# Patient Record
Sex: Female | Born: 1980 | Race: Asian | Hispanic: No | Marital: Single | Smoking: Never smoker
Health system: Southern US, Community
[De-identification: ages and names within clinical notes are randomized; demographics above are authoritative.]

## PROBLEM LIST (undated history)

## (undated) DIAGNOSIS — B029 Zoster without complications: Secondary | ICD-10-CM

---

## 1999-04-02 ENCOUNTER — Ambulatory Visit (HOSPITAL_COMMUNITY): Admission: RE | Admit: 1999-04-02 | Discharge: 1999-04-02 | Payer: Self-pay | Admitting: *Deleted

## 1999-05-09 ENCOUNTER — Inpatient Hospital Stay (HOSPITAL_COMMUNITY): Admission: AD | Admit: 1999-05-09 | Discharge: 1999-05-12 | Payer: Self-pay | Admitting: *Deleted

## 1999-05-09 ENCOUNTER — Encounter (INDEPENDENT_AMBULATORY_CARE_PROVIDER_SITE_OTHER): Payer: Self-pay

## 1999-05-14 ENCOUNTER — Inpatient Hospital Stay (HOSPITAL_COMMUNITY): Admission: AD | Admit: 1999-05-14 | Discharge: 1999-05-14 | Payer: Self-pay | Admitting: Obstetrics & Gynecology

## 1999-05-21 ENCOUNTER — Inpatient Hospital Stay (HOSPITAL_COMMUNITY): Admission: AD | Admit: 1999-05-21 | Discharge: 1999-05-21 | Payer: Self-pay | Admitting: Obstetrics & Gynecology

## 2001-10-04 ENCOUNTER — Inpatient Hospital Stay (HOSPITAL_COMMUNITY): Admission: EM | Admit: 2001-10-04 | Discharge: 2001-10-05 | Payer: Self-pay | Admitting: *Deleted

## 2001-10-04 ENCOUNTER — Encounter: Payer: Self-pay | Admitting: *Deleted

## 2007-06-09 ENCOUNTER — Inpatient Hospital Stay (HOSPITAL_COMMUNITY): Admission: AD | Admit: 2007-06-09 | Discharge: 2007-06-13 | Payer: Self-pay | Admitting: Gynecology

## 2007-06-10 ENCOUNTER — Encounter (INDEPENDENT_AMBULATORY_CARE_PROVIDER_SITE_OTHER): Payer: Self-pay | Admitting: Obstetrics

## 2008-06-24 ENCOUNTER — Ambulatory Visit: Payer: Self-pay | Admitting: Family Medicine

## 2008-06-24 ENCOUNTER — Inpatient Hospital Stay (HOSPITAL_COMMUNITY): Admission: AD | Admit: 2008-06-24 | Discharge: 2008-06-24 | Payer: Self-pay | Admitting: Obstetrics & Gynecology

## 2008-09-28 ENCOUNTER — Inpatient Hospital Stay (HOSPITAL_COMMUNITY): Admission: AD | Admit: 2008-09-28 | Discharge: 2008-09-28 | Payer: Self-pay | Admitting: Obstetrics

## 2008-09-30 ENCOUNTER — Inpatient Hospital Stay (HOSPITAL_COMMUNITY): Admission: AD | Admit: 2008-09-30 | Discharge: 2008-10-03 | Payer: Self-pay | Admitting: Obstetrics

## 2008-12-09 IMAGING — US US OB TRANSVAGINAL MODIFY
1 series · 14 of 25 positions shown · non-contrast
Comparison: none

OBSTETRICAL ULTRASOUND:

 This ultrasound exam was performed in the [HOSPITAL] Ultrasound Department.  The OB US report was generated in the AS system, and faxed to the ordering physician.  This report is also available in [REDACTED] PACS.

[Series 1: us ob transvaginal modify · 25 acquisitions, 14 frames shown]
[im 1/25]
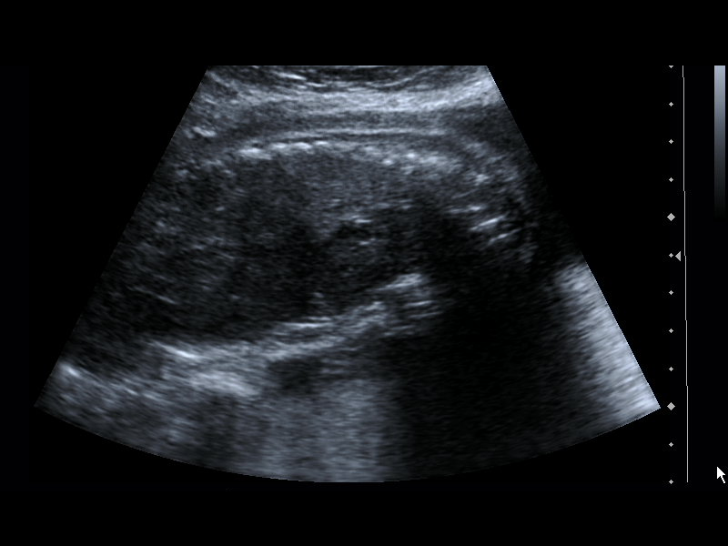
[im 3/25]
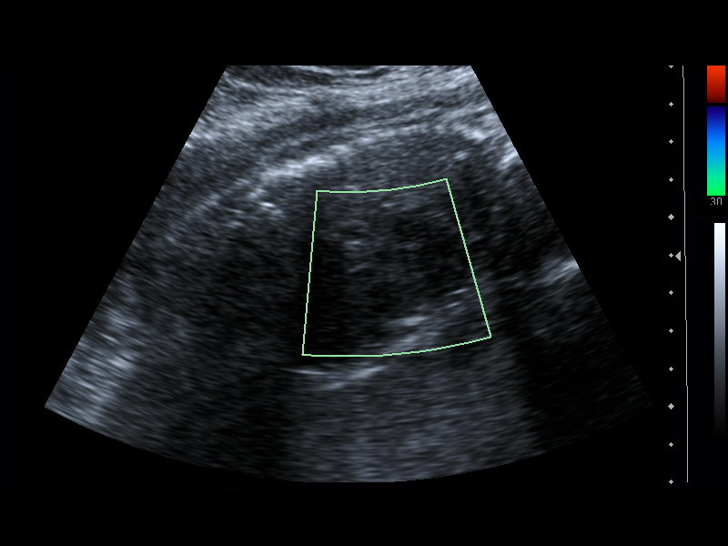
[im 5/25]
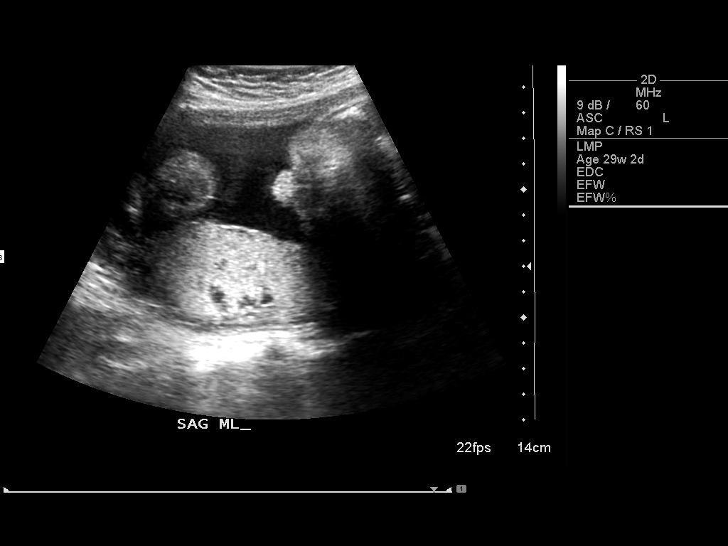
[im 7/25]
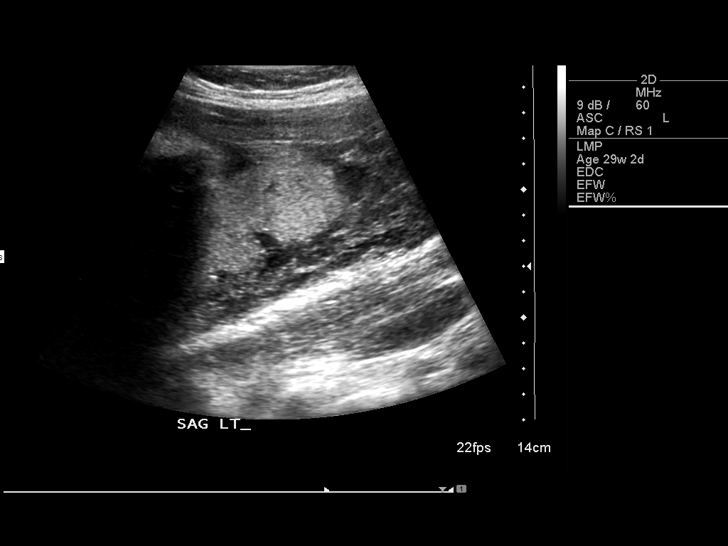
[im 9/25]
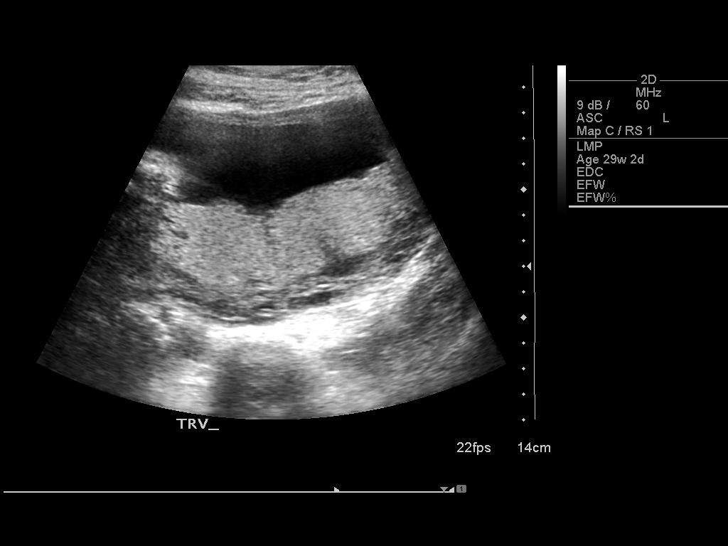
[im 10/25]
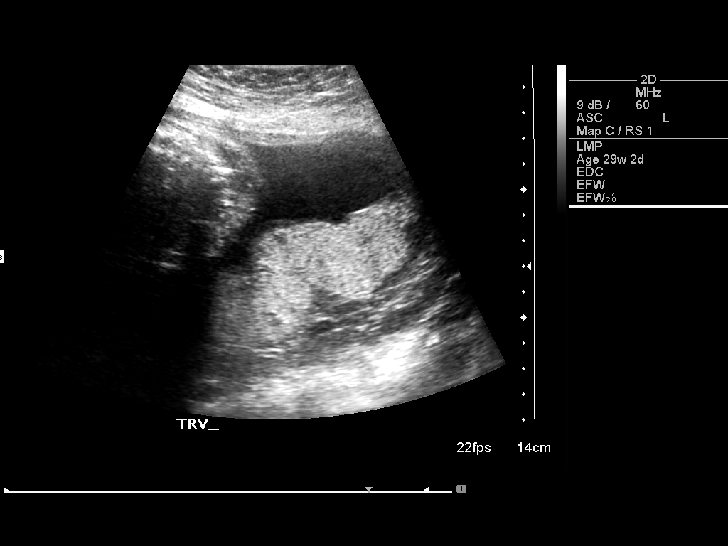
[im 12/25]
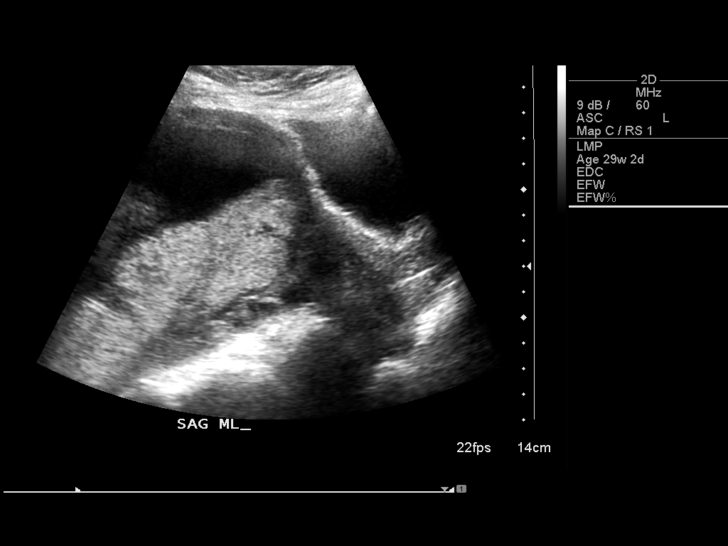
[im 14/25]
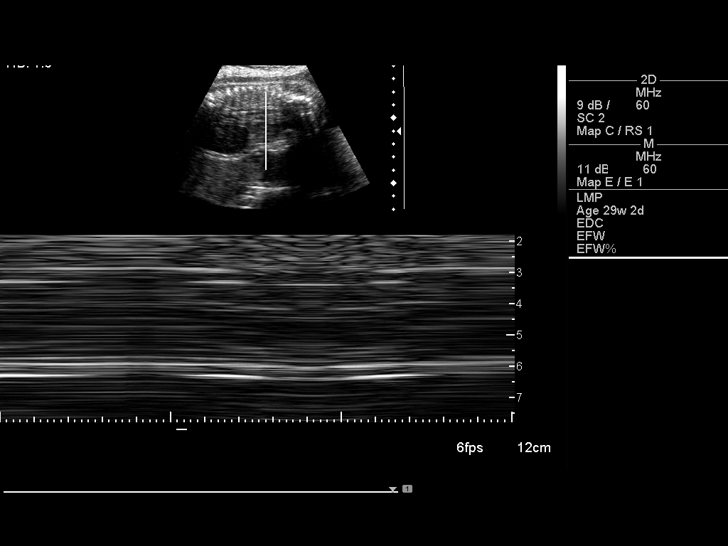
[im 16/25]
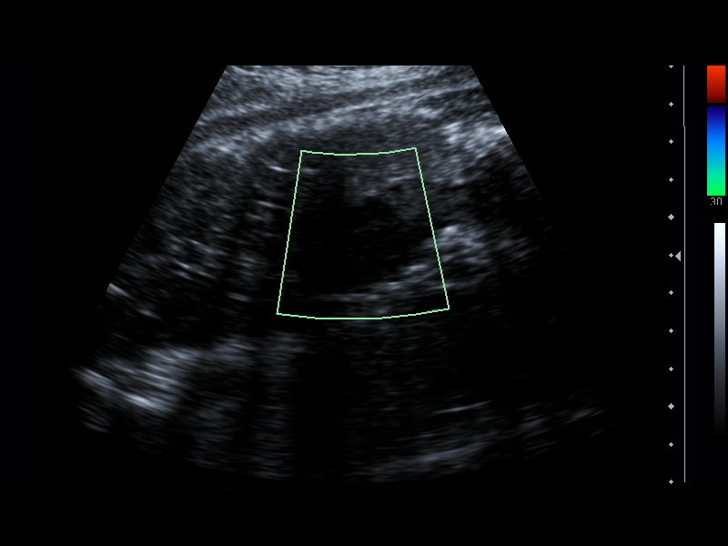
[im 17/25]
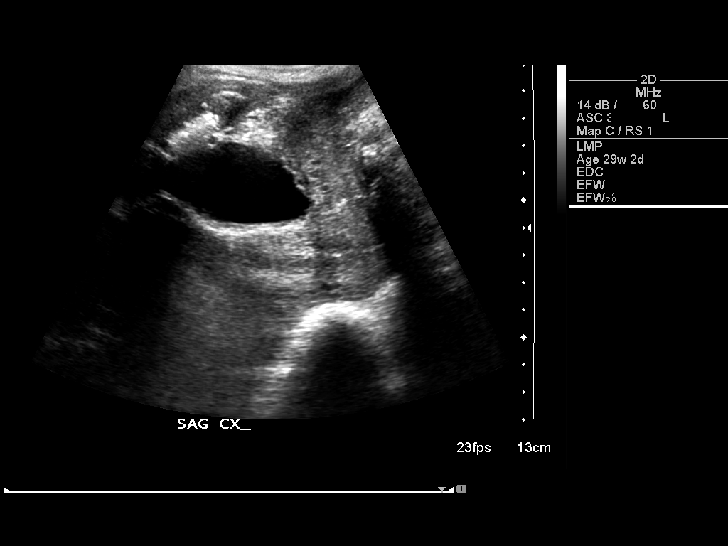
[im 19/25]
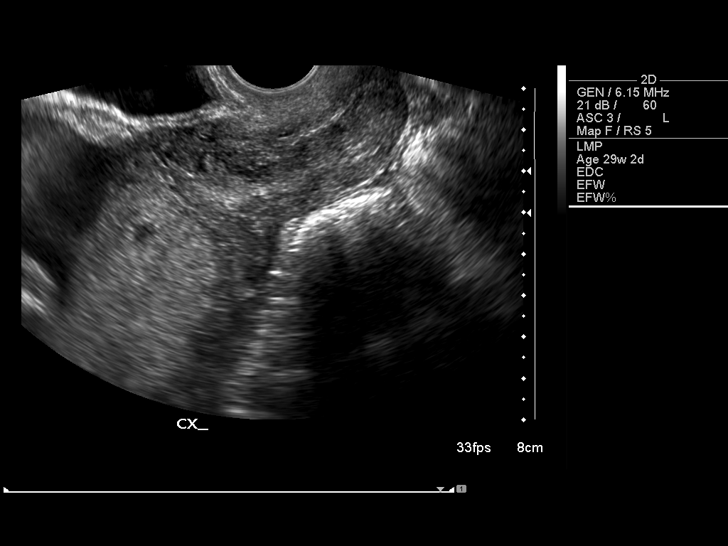
[im 21/25]
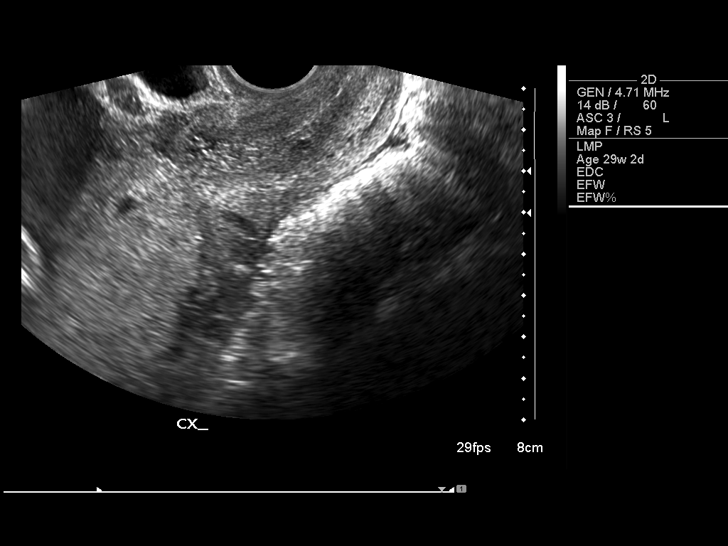
[im 23/25]
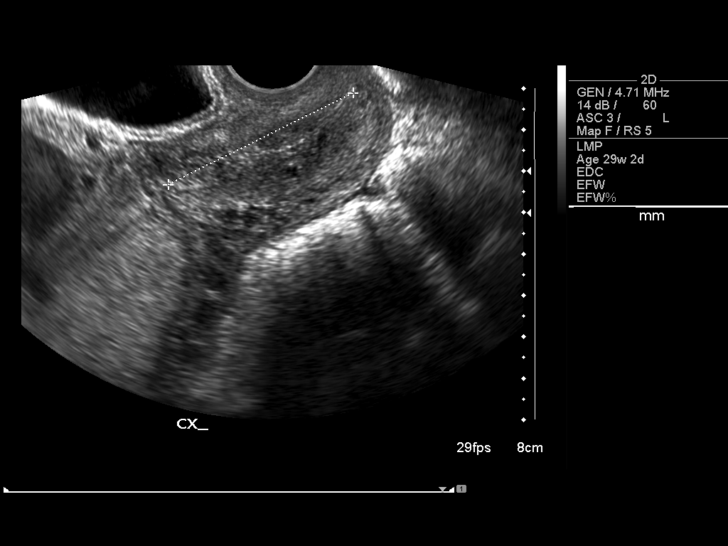
[im 25/25]
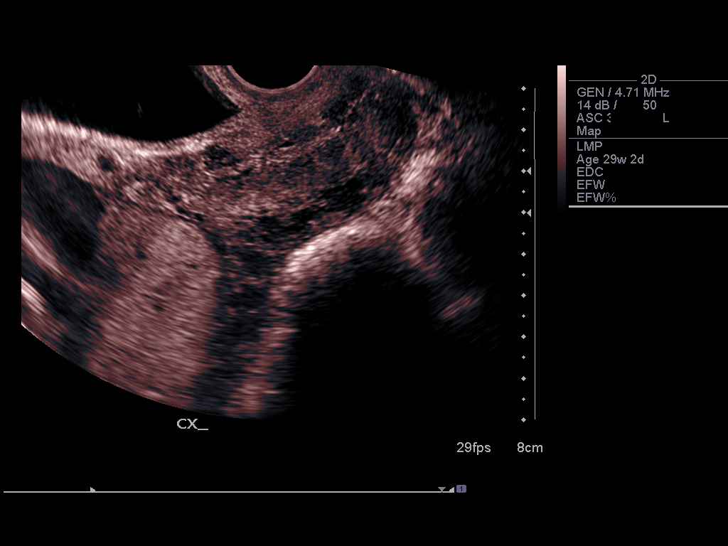

[14 of 25 positions shown; findings below may reference images not displayed]

IMPRESSION: See AS Obstetric US report.

## 2011-03-12 NOTE — Op Note (Signed)
[name], Melinda Alvarez                   ACCOUNT NO.:  [id number]   MEDICAL RECORD NO.:  [id number]          PATIENT TYPE:  INP   LOCATION:  9109                          FACILITY:  [facility]   PHYSICIAN:  Kathreen Cosier, M.D.DATE OF BIRTH:  05-14-1981   DATE OF PROCEDURE:  09/30/2008  DATE OF DISCHARGE:  10/03/2008                               OPERATIVE REPORT   PREOPERATIVE DIAGNOSES:  Previous cesarean section at term with ruptured  membranes.   POSTOPERATIVE DIAGNOSES:  Previous cesarean section at term with  ruptured membranes.   SURGEON:  Kathreen Cosier, MD   FIRST ASSISTANT:  Charles A. Clearance Coots, M.D.   ANESTHESIA:  Spinal.   PROCEDURE:  The patient was placed on the operating table in supine  position.  After the spinal administered,  abdomen prepped and draped.  Bladder emptied with a Foley catheter.  Transverse suprapubic incision  made through the old scar and carried down to the direct fascia.  Fascia  cleaned and incised to the length of the incision.  Recti muscles  retracted laterally.  Peritoneum incised  longitudinally.  Transverse  incision made in the visceral peritoneum above the bladder,  mobilized  inferiorly.  Transverse lower uterine incision made and the fluid was  clear.  She had a female, Apgar 9 and 9, weighing 6 pounds 7 ounces from  the LOA position.  The placenta was fundal removed manually and sent to  Labor and Delivery. Uterine cavity cleaned with dry laps.  The uterine  incision closed in 1 layer with continuous suture of #1 chromic.  Hemostasis was satisfactory. Bladder flap reattached with 2-0 chromic.  Uterus well contracted.  Tubes and ovaries normal.  Abdomen closed in  layers, peritoneum continuous suture of 0-chromic, fascia continuous  suture of 0-Dexon and skin closed with subcuticular stitch of 4-0  Monocryl.  Blood loss 800 mL.           ______________________________  Kathreen Cosier, M.D.     BAM/MEDQ  D:  11/09/2008  T:   11/09/2008  Job:  [id number]

## 2011-03-12 NOTE — Op Note (Signed)
NAMERANDAL, YEPIZ                   ACCOUNT NO.:  [id number]   MEDICAL RECORD NO.:  [id number]          PATIENT TYPE:  INP   LOCATION:  9303                          FACILITY:  [facility]   PHYSICIAN:  Kathreen Cosier, M.D.DATE OF BIRTH:  09-06-1981   DATE OF PROCEDURE:  06/10/2007  DATE OF DISCHARGE:                               OPERATIVE REPORT   PREOPERATIVE DIAGNOSIS:  Intrauterine fetal demise at 30 weeks and  posterior placenta previa.   SURGEON:  Dr. Gaynell Face   FIRST ASSISTANT:  Dr. Clearance Coots.   PROCEDURE:  Repeat low transverse cesarean section.   DESCRIPTION OF PROCEDURE:  Using spinal.  The patient in the supine  position.  Abdomen prepped and draped. Bladder emptied with Foley  catheter.  Transverse suprapubic incision made through old scar, carried  down to rectus fascia. Fascia cleaned and incised length of incision.  Recti muscles retracted laterally.  Peritoneum incised longitudinally.  Transverse lower uterine incision made.  Fluid clear.  The patient  delivered a stillborn female weighing 2 pounds 6 ounces from the  transverse position. The placenta was posterior previa removed manually  and sent to pathology.  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  The  umbilicus and the cord was also hyper twisted.  Uterine cavity cleaned  with dry laps.  Uterine incision closed in one layer with continuous  suture of #1 chromic.  Bladder flap reattached 2-0 chromic.  Tubes and  ovaries normal.  Abdomen closed in layers, peritoneum continuous suture  of 0 chromic, fascia continuous suture of 0 Dexon, skin closed with  subcuticular stitch of 4-0 Monocryl.  Blood loss 600 mL.           ______________________________  Kathreen Cosier, M.D.     BAM/MEDQ  D:  06/10/2007  T:  06/10/2007  Job:  [id number]

## 2011-03-15 NOTE — Discharge Summary (Signed)
[name], Melinda Alvarez                   ACCOUNT NO.:  [id number]   MEDICAL RECORD NO.:  [id number]          PATIENT TYPE:  INP   LOCATION:  9303                          [facility]ILITY:  [facility]   PHYSICIAN:  Kathreen Cosier, M.D.DATE OF BIRTH:  [birth year]   DATE OF ADMISSION:  06/09/2007  DATE OF DISCHARGE:  06/13/2007                               DISCHARGE SUMMARY   HISTORY:  The patient is a 30 year old gravida 3, para 2, 0,  0, 2, who  had two previous C-sections.  Her EDC is August 23, 2007.  The patient  came in saying that she has no movement of the baby since Monday, prior  to admission.  An ultrasound confirmed fetal demise at 30 weeks.  She  also had a posterior placenta previa and two previous C-sections.   HOSPITAL COURSE:  She underwent a repeat low transverse cesarean section  because of fetal demise and placenta previa.  The baby was in the  transverse lie, and weighed 2 pounds 6 ounces.  The cord was infarcted  near the insertion into the umbilicus, and the cord was twisted multiple  times.  The blood loss was approximately 600 mL.   On admission her hemoglobin was 12.6.  Postoperatively the hemoglobin  was 11.8.  Platelets were 203 and 211.  White count was 15 and 20.  PT  and PTT normal.  Urine negative.  RPR negative.  HIV negative.   DISPOSITION:  She did well postoperatively and was discharged on the  third postoperative day on Tylox one p.o. q.3-4h. p.r.n.   FOLLOWUP:  She is to see me in six weeks.   DISCHARGE DIAGNOSES:  1. Repeat low transverse cesarean section.  2. Fetal demise at 30 weeks with a placenta previa.           ______________________________  Kathreen Cosier, M.D.     BAM/MEDQ  D:  07/08/2007  T:  07/08/2007  Job:  [id number]

## 2011-03-15 NOTE — Discharge Summary (Signed)
Melinda Alvarez, MADRUGA                   ACCOUNT NO.:  [id number]   MEDICAL RECORD NO.:  [id number]          PATIENT TYPE:  INP   LOCATION:  9109                          [facility]ILITY[facility]  [facility]   PHYSICIAN:  Kathreen Cosier, M.D.DATE OF BIRTH:  10/05/1981   DATE OF ADMISSION:  09/30/2008  DATE OF DISCHARGE:  10/03/2008                               DISCHARGE SUMMARY   The patient is a 30 year old, gravida 4, para 1-2-0-2, [facility] October 09, 2008, three C-sections.  She was admitted with ruptured membranes, fluid  clear, and contracting.  She underwent a repeat low-transverse cesarean  section and a female Apgar 9 and 9, weighing 6 pounds 7 ounces.  Blood  loss 800 mL.  Postop, she did well.  On the third postoperative day, her  hemoglobin was 10.1.  She was discharged home on Tylox and ferrous  sulfate to see me in 6 weeks.   DISCHARGE DIAGNOSES:  Status post repeat low transverse cesarean section  at term in labor, ruptured membranes.           ______________________________  Kathreen Cosier, M.D.     BAM/MEDQ  D:  11/02/2008  T:  11/02/2008  Job:  [id number]

## 2011-08-02 LAB — CBC
HCT: 40.7 % (ref 36.0–46.0)
Hemoglobin: 10.1 g/dL — ABNORMAL LOW (ref 12.0–15.0)
Hemoglobin: 13 g/dL (ref 12.0–15.0)
MCHC: 31.9 g/dL (ref 30.0–36.0)
MCHC: 32.9 g/dL (ref 30.0–36.0)
MCV: 79.2 fL (ref 78.0–100.0)
MCV: 79.7 fL (ref 78.0–100.0)
Platelets: 169 10*3/uL (ref 150–400)
RBC: 3.89 MIL/uL (ref 3.87–5.11)
RBC: 5.1 MIL/uL (ref 3.87–5.11)
RDW: 14.5 % (ref 11.5–15.5)
WBC: 14.8 10*3/uL — ABNORMAL HIGH (ref 4.0–10.5)
WBC: 18.6 10*3/uL — ABNORMAL HIGH (ref 4.0–10.5)

## 2011-08-12 LAB — CBC
HCT: 35.8 — ABNORMAL LOW
Hemoglobin: 12.6
MCHC: 32.5
MCV: 83.4
Platelets: 211
RBC: 4.65
RDW: 13.7
WBC: 15 — ABNORMAL HIGH
WBC: 20.9 — ABNORMAL HIGH

## 2011-08-12 LAB — URINALYSIS, ROUTINE W REFLEX MICROSCOPIC
Glucose, UA: NEGATIVE
Hgb urine dipstick: NEGATIVE
Protein, ur: NEGATIVE
Specific Gravity, Urine: 1.015
Urobilinogen, UA: 0.2

## 2011-08-12 LAB — ABO/RH: ABO/RH(D): B POS

## 2011-08-12 LAB — PROTIME-INR: INR: 0.9

## 2014-06-03 ENCOUNTER — Ambulatory Visit (INDEPENDENT_AMBULATORY_CARE_PROVIDER_SITE_OTHER): Payer: 59 | Admitting: Family Medicine

## 2014-06-03 VITALS — BP 110/62 | HR 62 | Temp 98.5°F | Resp 18 | Ht 60.5 in | Wt 138.0 lb

## 2014-06-03 DIAGNOSIS — R51 Headache: Secondary | ICD-10-CM

## 2014-06-03 DIAGNOSIS — R110 Nausea: Secondary | ICD-10-CM

## 2014-06-03 NOTE — Progress Notes (Signed)
Chief Complaint:  Chief Complaint  Patient presents with  . Headache    since yesterday   . Nausea    HPI: Melinda Alvarez is a 33 y.o. female who is here for intermittent HA for the last 1  Year, during these time she feels dizzy, she feels sick to her stomach the first thing she has heaaches,  Light and noise bothers her. Mom has headache issues. She has tried advil, took it 2x at 7:30 pm last night  and this morninng at 7 am. She took the advil 200 mg 2 pills. She has no problems Eating and drinking. Does not feel numbness and tingling.  No cough. NO URI sxs, no known triggers but  Sometimes certain foods trigger her sxs, yesterday she ate 1x and then felt sick. She feels actually better today. She is a Advertising account plannernail technician and will often go without food or drink until there is a down time and she has never known to have any ypoglycemia.  She deneie sany confusion or eye pain. LMP was 1 week ago. She is positive she is not pregnant.   History reviewed. No pertinent past medical history. History reviewed. No pertinent past surgical history. History   Social History  . Marital Status: Single    Spouse Name: N/A    Number of Children: N/A  . Years of Education: N/A   Social History Main Topics  . Smoking status: Never Smoker   . Smokeless tobacco: None  . Alcohol Use: No  . Drug Use: No  . Sexual Activity: None   Other Topics Concern  . None   Social History Narrative  . None   History reviewed. No pertinent family history. No Known Allergies Prior to Admission medications   Medication Sig Start Date End Date Taking? Authorizing Provider  ibuprofen (ADVIL,MOTRIN) 200 MG tablet Take 200 mg by mouth every 6 (six) hours as needed.   Yes Historical Provider, MD     ROS: The patient denies fevers, chills, night sweats, unintentional weight loss, chest pain, palpitations, wheezing, dyspnea on exertion, nausea, vomiting, abdominal pain, dysuria, hematuria, melena, numbness,  weakness, or tingling.   All other systems have been reviewed and were otherwise negative with the exception of those mentioned in the HPI and as above.    PHYSICAL EXAM: Filed Vitals:   06/03/14 1421  BP: 110/62  Pulse: 62  Temp: 98.5 F (36.9 C)  Resp: 18   Filed Vitals:   06/03/14 1421  Height: 5' 0.5" (1.537 m)  Weight: 138 lb (62.596 kg)   Body mass index is 26.5 kg/(m^2).  General: Alert, no acute distress HEENT:  Normocephalic, atraumatic, oropharynx patent. EOMI, PERRLA, fundo exam normal Cardiovascular:  Regular rate and rhythm, no rubs murmurs or gallops.  No Carotid bruits, radial pulse intact. No pedal edema.  Respiratory: Clear to auscultation bilaterally.  No wheezes, rales, or rhonchi.  No cyanosis, no use of accessory musculature GI: No organomegaly, abdomen is soft and non-tender, positive bowel sounds.  No masses. Skin: No rashes. Neurologic: Facial musculature symmetric. CN 2-12 grossly normal Psychiatric: Patient is appropriate throughout our interaction. Lymphatic: No cervical lymphadenopathy Musculoskeletal: Gait intact.   LABS:    EKG/XRAY:   Primary read [facility]   ASSESSMENT/PLAN: Encounter Diagnoses  Name Primary?  . Headache(784.0) Yes  . Nausea alone    Decline meds Try otc medicine since working Wanted labs so will return for annual visit to get fastign  labs on her day off F/u prn  Gross sideeffects, risk and benefits, and alternatives of medications d/w patient. Patient is aware that all medications have potential sideeffects and we are unable to predict every sideeffect or drug-drug interaction that may occur.  Melinda Steenbergen PHUONG, DO 06/03/2014 5:22 PM

## 2014-06-03 NOTE — Patient Instructions (Signed)
?au n?a ??u. (Migraine Headache) ?au n?a ??u l m?t c?n ?au nhi v nhi?u ? m?t bn ??u c?a qu v?. M?t c?n ?au n?a ??u c th? ko di t? 30 pht ??n vi ti?ng. NGUYN NHN.  Nguyn nhn chnh xc c?a ?au n?a ??u khng ph?i lc no c?ng xc ??nh ???c. Tuy nhin, ?au n?a ??u c th? pht sinh khi cc dy th?n kinh trong no b? kch thch v gi?i phng ra cc ha ch?t gy vim. Hi?n t??ng ny gy ra ?au. M?t s? v?n ?? khc c?ng c th? gy ra ?au n?a ??u, ch?ng h?n:  R??u.  Ht thu?c l.  C?ng th?ng.  Kinh nguy?t.  Pho mt ?? lu.  Th?c ?n ho?c ?? u?ng c ch?a nitrat, glutamate, aspartame, ho?c tyramine.  Thi?u ng?.  S c la.  Caffeine.  ?i.  G?ng s?c.  M?t m?i.  Thu?c dng ?? ?i?u tr? ?au ng?c (nitroglycerine), vin thu?c trnh [place], estrogen, v m?t s? thu?c ?i?u tr? huy?t p. D?U HI?U V TRI?U CH?NG  ?au ? m?t bn ho?c c? hai bn ??u.  ?au t?ng c?n ho?c ?au nhi.  ?au nhi?u lm c?n tr? cc ho?t ??ng hng ngy.  C?n ?au k?ch pht khi c b?t k? ho?t ??ng th? ch?t no.  Bu?n nn, nn m?a, ho?c c? hai.  Chng m?t.  ?au khi ti?p xc v?i nh sng chi, ti?ng ?n l?n, ho?c ho?t ??ng.  Nh?y c?m ton thn v?i nh sng chi, ti?ng ?n l?n, ho?c mi. Tr??c khi qu v? b? ?au n?a ??u, qu v? c th? c nh?ng d?u hi?u c?nh bo s?p c c?n ?au n?a ??u (ti?n tri?u). M?t ti?n tri?u c th? bao g?m:  Nhn th?y nh sng lo ln.  Nhn th?y nh?ng ?i?m sng, qu?ng sng, ho?c cc ???ng ngo?n ngoo.  C th? tr??ng hnh ?ng ho?c nhn m?.  C c?m gic t b ho?c ?au bu?t.  Ni kh.  B? y?u c?. CH?N ?ON  ?au n?a ??u th??ng ???c ch?n ?on d?a vo:  Cc tri?u ch?ng.  Khm th?c th?.  Ch?p CT ho?c MRI ??u qu v?. Cc ki?m tra b?ng hnh ?nh ny khng th? ch?n ?on ???c ?au n?a ??u, nh?ng chng c th? gip lo?i tr? nh?ng nguyn nhn gy ?au ??u khc. ?I?U TR? C th? cho dng thu?c gi?m ?au v ch?ng bu?n nn. C?ng c th? cho dng thu?c ?? ng?n ng?a ti di?n ?au n?a ??u.  H??NG D?N  CH?M [place] T?I NH  Ch? s? d?ng thu?c khng c?n k ??n ho?c thu?c c?n k ??n ?? gi?m ?au ho?c gi?m c?m gic kh ch?u theo ch? d?n c?a chuyn gia ch?m [place] s?c kh?e c?a qu v?. Khng nn dng thu?c m gy nghi?n ko di.  N?m trong m?t phng t?i, yn t?nh khi qu v? b? ?au n?a ??u.  Ghi nh?t k hng ngy ?? tm ra ?i?u g c th? gy cc c?n ?au n?a ??u. Ch?ng h?n, hy ghi ra:  Qu v? ?n v u?ng g.  Qu v? ? ng? bao lu.  B?t k? thay ??i no trong ch? ?? ?n ho?c thu?c men.  H?n ch? s? d?ng r??u.  B? thu?c l, n?u qu v? ht thu?c.  Ng? 7 - 9 ti?ng, ho?c theo khuy?n ngh? c?a chuyn gia ch?m [place] s?c kh?e.  H?n ch? c?ng th?ng.  Gi? cho nh sng d?u nh? n?u nh sng m?nh lm qu v? kh ch?u v lm ch?ng ?au n?a ??  u t?i t? h?n. NGAY L?P T?C ?I KHM N?U:   C?n ?au n?a ??u c?a qu v? n?ng h?n.  Qu v? b? s?t.  Qu v? b? c?ng c?.  Qu v? b? m?t th? l?c.  Qu v? b? y?u c? ho?c m?t ki?m sot c?.  Qu v? b?t ??u m?t th?ng b?ng ho?c ?i l?i kh kh?n.  Qu v? c?m th?y mu?n ng?t ho?c ng?t.  Qu v? c nh?ng tri?u ch?ng n?ng khc v?i nh?ng tri?u ch?ng ban ??u. ??M B?O QU V?:   Hi?u r cc h??ng d?n ny.  S? theo di tnh tr?ng c?a mnh.  S? yu c?u tr? gip ngay l?p t?c n?u qu v? c?m th?y khng kh?e ho?c th?y tr?m tr?ng h?n. Document Released: 10/14/2005 Document Revised: 08/04/2013 [facility] Patient Information 2015 [place], [place]. This information is not intended to replace advice given to you by your health care provider. Make sure you discuss any questions you have with your health care provider. Migraine Headache A migraine headache is an intense, throbbing pain on one or both sides of your head. A migraine can last for 30 minutes to several hours. CAUSES  The exact cause of a migraine headache is not always known. However, a migraine may be caused when nerves in the brain become irritated and release chemicals that cause inflammation. This causes pain. Certain things may also  trigger migraines, such as:  Alcohol.  Smoking.  Stress.  Menstruation.  Aged cheeses.  Foods or drinks that contain nitrates, glutamate, aspartame, or tyramine.  Lack of sleep.  Chocolate.  Caffeine.  Hunger.  Physical exertion.  Fatigue.  Medicines used to treat chest pain (nitroglycerine), birth control pills, estrogen, and some blood pressure medicines. SIGNS AND SYMPTOMS  Pain on one or both sides of your head.  Pulsating or throbbing pain.  Severe pain that prevents daily activities.  Pain that is aggravated by any physical activity.  Nausea, vomiting, or both.  Dizziness.  Pain with exposure to bright lights, loud noises, or activity.  General sensitivity to bright lights, loud noises, or smells. Before you get a migraine, you may get warning signs that a migraine is coming (aura). An aura may include:  Seeing flashing lights.  Seeing bright spots, halos, or zigzag lines.  Having tunnel vision or blurred vision.  Having feelings of numbness or tingling.  Having trouble talking.  Having muscle weakness. DIAGNOSIS  A migraine headache is often diagnosed based on:  Symptoms.  Physical exam.  A CT scan or MRI of your head. These imaging tests cannot diagnose migraines, but they can help rule out other causes of headaches. TREATMENT Medicines may be given for pain and nausea. Medicines can also be given to help prevent recurrent migraines.  HOME CARE INSTRUCTIONS  Only take over-the-counter or prescription medicines for pain or discomfort as directed by your health care provider. The use of long-term narcotics is not recommended.  Lie down in a dark, quiet room when you have a migraine.  Keep a journal to find out what may trigger your migraine headaches. For example, write down:  What you eat and drink.  How much sleep you get.  Any change to your diet or medicines.  Limit alcohol consumption.  Quit smoking if you smoke.  Get 7-9  hours of sleep, or as recommended by your health care provider.  Limit stress.  Keep lights dim if bright lights bother you and make your migraines worse. SEEK IMMEDIATE MEDICAL CARE IF:   Your  migraine becomes severe.  You have a fever.  You have a stiff neck.  You have vision loss.  You have muscular weakness or loss of muscle control.  You start losing your balance or have trouble walking.  You feel faint or pass out.  You have severe symptoms that are different from your first symptoms. MAKE SURE YOU:   Understand these instructions.  Will watch your condition.  Will get help right away if you are not doing well or get worse. Document Released: 10/14/2005 Document Revised: 02/28/2014 Document Reviewed: 06/21/2013 [facility] Patient Information 2015 [place], [place]. This information is not intended to replace advice given to you by your health care provider. Make sure you discuss any questions you have with your health care provider.

## 2017-12-30 ENCOUNTER — Other Ambulatory Visit: Payer: Self-pay

## 2017-12-30 ENCOUNTER — Ambulatory Visit (INDEPENDENT_AMBULATORY_CARE_PROVIDER_SITE_OTHER): Payer: BLUE CROSS/BLUE SHIELD | Admitting: Family Medicine

## 2017-12-30 ENCOUNTER — Encounter: Payer: Self-pay | Admitting: Family Medicine

## 2017-12-30 VITALS — BP 110/64 | HR 71 | Temp 98.0°F | Ht 60.0 in | Wt 137.0 lb

## 2017-12-30 DIAGNOSIS — R197 Diarrhea, unspecified: Secondary | ICD-10-CM | POA: Diagnosis not present

## 2017-12-30 DIAGNOSIS — Z13220 Encounter for screening for lipoid disorders: Secondary | ICD-10-CM | POA: Diagnosis not present

## 2017-12-30 DIAGNOSIS — E049 Nontoxic goiter, unspecified: Secondary | ICD-10-CM | POA: Diagnosis not present

## 2017-12-30 DIAGNOSIS — R1084 Generalized abdominal pain: Secondary | ICD-10-CM | POA: Insufficient documentation

## 2017-12-30 DIAGNOSIS — N938 Other specified abnormal uterine and vaginal bleeding: Secondary | ICD-10-CM | POA: Diagnosis not present

## 2017-12-30 DIAGNOSIS — Z0000 Encounter for general adult medical examination without abnormal findings: Secondary | ICD-10-CM | POA: Diagnosis not present

## 2017-12-30 DIAGNOSIS — K30 Functional dyspepsia: Secondary | ICD-10-CM

## 2017-12-30 LAB — POCT URINALYSIS DIP (MANUAL ENTRY)
Bilirubin, UA: NEGATIVE
Glucose, UA: NEGATIVE mg/dL
Ketones, POC UA: NEGATIVE mg/dL
Leukocytes, UA: NEGATIVE
Nitrite, UA: NEGATIVE
Protein Ur, POC: NEGATIVE mg/dL
Spec Grav, UA: 1.025 (ref 1.010–1.025)
Urobilinogen, UA: 0.2 E.U./dL
pH, UA: 6 (ref 5.0–8.0)

## 2017-12-30 NOTE — Patient Instructions (Signed)
     IF you received an x-ray today, you will receive an invoice from [facility]. Please contact [facility] at [phone] with questions or concerns regarding your invoice.   IF you received labwork today, you will receive an invoice from [facility]. Please contact [facility] at [phone] with questions or concerns regarding your invoice.   Our billing staff will not be able to assist you with questions regarding bills from these companies.  You will be contacted with the lab results as soon as they are available. The fastest way to get your results is to activate your My Chart account. Instructions are located on the last page of this paperwork. If you have not heard from us regarding the results in 2 weeks, please contact this office.     

## 2017-12-30 NOTE — Progress Notes (Deleted)
   3/5/20194:49 PM  Melinda Alvarez Apr 16, 1981, [age] y.o. female [id number]  Chief Complaint  Patient presents with  . Pain    Having pain in the stomach when eating spicy or left overs. This is been going on for 2 yrs. Having frequent diarrrhea. Having painful periods for the past 2 yrs. Pt says she does not need pap, that was done 9/18    HPI:   Patient is a 37 y.o. female with past medical history significant for *** who presents today for ***  Depression screen PHQ 2/9 12/30/2017  Decreased Interest 0  Down, Depressed, Hopeless 0  PHQ - 2 Score 0    No Known Allergies  Prior to Admission medications   Medication Sig Start Date End Date Taking? Authorizing Provider  ibuprofen (ADVIL,MOTRIN) 200 MG tablet Take 200 mg by mouth every 6 (six) hours as needed.    [provider]    History reviewed. No pertinent past medical history.  Past Surgical History:  Procedure Laterality Date  . CESAREAN SECTION      Social History   Tobacco Use  . Smoking status: Never Smoker  . Smokeless tobacco: Never Used  Substance Use Topics  . Alcohol use: No    Family History  Problem Relation Age of Onset  . Healthy Mother   . Healthy Father   . Healthy Sister   . Healthy Brother     ROS   OBJECTIVE:  There were no vitals taken for this visit.  Physical Exam  No results found for this or any previous visit (from the past 24 hour(s)).  No results found.   ASSESSMENT and PLAN  ***  No Follow-up on file.    Myles LippsIrma M Santiago, MD [facility] [address] [place], [place] [address] Ph.  [phone] Fax [phone]

## 2017-12-30 NOTE — Progress Notes (Signed)
3/5/20194:53 PM  Melinda Alvarez 1980/12/28, 37 y.o. female [id number]  Chief Complaint  Patient presents with  . Annual Exam    Having pain in the stomach when eating spicy or left overs. This is been going on for 2 yrs. Having frequent diarrrhea. Having painful periods for the past 2 yrs. Pt says she does not need pap, that was done 9/18    HPI:   Patient is a 37 y.o. female  who presents today for CPE  Last CPE was several years ago [facility], started on OCPs, tolerating well, happy with method G4P4 Denies abnormal pap Reports menses light but last about 2 weeks Declines flu vaccine  Main concern is recurring diarrhea, states that for past 2 years, worsening in frequency and intensity, now about once or twice a week with over 10 episodes of watery diarrhea in one day, normally resolve within 24 hours Triggers food, spicy foods but sometimes over things Starting to cause food aversion as concern for severity of symptoms, but appetite itself ok She feels some indigestion but no frank abd pain during these periods She denies any nausea or vomiting, weight loss.  Denies fever, chills, night sweats, swollen nodes She denies any fhx IBD, GI cancers   [facility] 12/30/2017  Decreased Interest 0  Down, Depressed, Hopeless 0  PHQ - 2 Score 0    No Known Allergies  Prior to Admission medications   Medication Sig Start Date End Date Taking? Authorizing Provider  ibuprofen (ADVIL,MOTRIN) 200 MG tablet Take 200 mg by mouth every 6 (six) hours as needed.    [provider]  Ortho tri cyclen lo 1  Tab daily  History reviewed. No pertinent past medical history.  Past Surgical History:  Procedure Laterality Date  . CESAREAN SECTION      Social History   Tobacco Use  . Smoking status: Never Smoker  . Smokeless tobacco: Never Used  Substance Use Topics  . Alcohol use: No    Family History  Problem Relation Age of Onset  . Healthy  Mother   . Healthy Father   . Healthy Sister   . Healthy Brother     Review of Systems  Constitutional: Negative for chills, fever, malaise/fatigue and weight loss.  HENT: Negative for congestion, ear pain, hearing loss and sore throat.   Eyes: Negative for blurred vision and double vision.  Respiratory: Negative for cough and shortness of breath.   Cardiovascular: Negative for chest pain, palpitations and leg swelling.  Gastrointestinal: Positive for diarrhea. Negative for abdominal pain, blood in stool, constipation, melena, nausea and vomiting.  Genitourinary: Negative for dysuria and hematuria.  Musculoskeletal: Negative for myalgias.  Neurological: Negative for dizziness, tingling and headaches.  Psychiatric/Behavioral: Negative for depression. The patient is not nervous/anxious.      OBJECTIVE:  Blood pressure 110/64, pulse 71, temperature 98 F (36.7 C), temperature source Oral, height 5' (1.524 m), weight 137 lb (62.1 kg), SpO2 99 %.  Physical Exam  Constitutional: She is oriented to person, place, and time and well-developed, well-nourished, and in no distress.  HENT:  Head: Normocephalic and atraumatic.  Right Ear: Hearing, tympanic membrane, external ear and ear canal normal.  Left Ear: Hearing, tympanic membrane, external ear and ear canal normal.  Mouth/Throat: Oropharynx is clear and moist.  Eyes: EOM are normal. Pupils are equal, round, and reactive to light.  Neck: Neck supple. Thyromegaly present. No thyroid mass present.  Cardiovascular: Normal rate, regular  rhythm, normal heart sounds and intact distal pulses. Exam reveals no gallop and no friction rub.  No murmur heard. Pulmonary/Chest: Effort normal and breath sounds normal. She has no wheezes. She has no rales.  Abdominal: Soft. Bowel sounds are normal. She exhibits no distension and no mass. There is no tenderness.  Musculoskeletal: Normal range of motion. She exhibits no edema.  Lymphadenopathy:    She  has no cervical adenopathy.  Neurological: She is alert and oriented to person, place, and time. She has normal reflexes. Gait normal.  Skin: Skin is warm and dry.  Psychiatric: Mood and affect normal.  Nursing note and vitals reviewed.   Results for orders placed or performed in visit on 12/30/17 (from the past 24 hour(s))  POCT urinalysis dipstick     Status: Abnormal   Collection Time: 12/30/17  5:05 PM  Result Value Ref Range   Color, UA yellow yellow   Clarity, UA clear clear   Glucose, UA negative negative mg/dL   Bilirubin, UA negative negative   Ketones, POC UA negative negative mg/dL   Spec Grav, UA 1.610 9.604 - 1.025   Blood, UA trace-intact (A) negative   pH, UA 6.0 5.0 - 8.0   Protein Ur, POC negative negative mg/dL   Urobilinogen, UA 0.2 0.2 or 1.0 E.U./dL   Nitrite, UA Negative Negative   Leukocytes, UA Negative Negative    ASSESSMENT and PLAN  1. Annual physical exam Routine HCM labs ordered. HCM reviewed/discussed. Anticipatory guidance regarding healthy weight, lifestyle and choices given.   2. Indigestion - POCT urinalysis dipstick  3. Diarrhea, unspecified type - CBC with Differential - Comprehensive metabolic panel - Ambulatory referral to Gastroenterology  4. Goiter - TSH - US THYROID; Future  5. DUB (dysfunctional uterine bleeding) - TSH  6. Screening for hyperlipidemia - Lipid panel  Return in about 6 weeks (around 02/10/2018).    Myles Lipps, MD Primary Care at [facility] [address] [place], [place] [address] Ph.  [phone] Fax [phone]

## 2017-12-31 LAB — CBC WITH DIFFERENTIAL/PLATELET
Basophils Absolute: 0 10*3/uL (ref 0.0–0.2)
Basos: 0 %
EOS (ABSOLUTE): 0.1 10*3/uL (ref 0.0–0.4)
Eos: 1 %
Hematocrit: 39.3 % (ref 34.0–46.6)
Hemoglobin: 12.7 g/dL (ref 11.1–15.9)
Immature Grans (Abs): 0 10*3/uL (ref 0.0–0.1)
Immature Granulocytes: 0 %
Lymphocytes Absolute: 2.3 10*3/uL (ref 0.7–3.1)
Lymphs: 30 %
MCH: 26.3 pg — ABNORMAL LOW (ref 26.6–33.0)
MCHC: 32.3 g/dL (ref 31.5–35.7)
MCV: 81 fL (ref 79–97)
Monocytes Absolute: 0.5 10*3/uL (ref 0.1–0.9)
Monocytes: 7 %
Neutrophils Absolute: 4.8 10*3/uL (ref 1.4–7.0)
Neutrophils: 62 %
Platelets: 212 10*3/uL (ref 150–379)
RBC: 4.83 x10E6/uL (ref 3.77–5.28)
RDW: 13.1 % (ref 12.3–15.4)
WBC: 7.6 10*3/uL (ref 3.4–10.8)

## 2017-12-31 LAB — COMPREHENSIVE METABOLIC PANEL
ALT: 12 IU/L (ref 0–32)
AST: 13 IU/L (ref 0–40)
Albumin/Globulin Ratio: 1.4 (ref 1.2–2.2)
Albumin: 4.2 g/dL (ref 3.5–5.5)
Alkaline Phosphatase: 67 IU/L (ref 39–117)
BUN/Creatinine Ratio: 17 (ref 9–23)
BUN: 11 mg/dL (ref 6–20)
Bilirubin Total: <0.2 mg/dL (ref 0.0–1.2)
CO2: 21 mmol/L (ref 20–29)
Calcium: 9.1 mg/dL (ref 8.7–10.2)
Chloride: 106 mmol/L (ref 96–106)
Creatinine, Ser: 0.64 mg/dL (ref 0.57–1.00)
GFR calc Af Amer: 131 mL/min/{1.73_m2} (ref >59)
GFR calc non Af Amer: 114 mL/min/{1.73_m2} (ref >59)
Globulin, Total: 3.1 g/dL (ref 1.5–4.5)
Glucose: 99 mg/dL (ref 65–99)
Potassium: 4.4 mmol/L (ref 3.5–5.2)
Sodium: 143 mmol/L (ref 134–144)
Total Protein: 7.3 g/dL (ref 6.0–8.5)

## 2017-12-31 LAB — LIPID PANEL
Chol/HDL Ratio: 3.3 ratio (ref 0.0–4.4)
Cholesterol, Total: 163 mg/dL (ref 100–199)
HDL: 49 mg/dL (ref >39)
LDL Calculated: 88 mg/dL (ref 0–99)
Triglycerides: 129 mg/dL (ref 0–149)
VLDL Cholesterol Cal: 26 mg/dL (ref 5–40)

## 2017-12-31 LAB — TSH: TSH: 0.923 u[IU]/mL (ref 0.450–4.500)

## 2018-01-03 ENCOUNTER — Encounter: Payer: Self-pay | Admitting: Family Medicine

## 2018-02-10 ENCOUNTER — Other Ambulatory Visit: Payer: Self-pay

## 2018-02-10 ENCOUNTER — Ambulatory Visit (INDEPENDENT_AMBULATORY_CARE_PROVIDER_SITE_OTHER): Payer: BLUE CROSS/BLUE SHIELD | Admitting: Family Medicine

## 2018-02-10 ENCOUNTER — Encounter: Payer: Self-pay | Admitting: Family Medicine

## 2018-02-10 VITALS — BP 122/70 | HR 74 | Temp 97.6°F | Resp 16 | Ht 60.0 in | Wt 134.8 lb

## 2018-02-10 DIAGNOSIS — R197 Diarrhea, unspecified: Secondary | ICD-10-CM

## 2018-02-10 DIAGNOSIS — E049 Nontoxic goiter, unspecified: Secondary | ICD-10-CM | POA: Diagnosis not present

## 2018-02-10 NOTE — Progress Notes (Signed)
   4/16/20198:43 AM  Melinda Alvarez 02-22-1981, 37 y.o. female [id number]  Chief Complaint  Patient presents with  . Follow-up    6 wk f/u, wants to know if she is diabetic, have high cholesterol     HPI:   Patient is a 37 y.o. female who presents today for followup on labs  She received my letter explaining her labs were NORMAL but she still wanted to go over them She has seen GI for her chronic diarrhea, started workup, but states that her diarrhea seems to have resolved spontaneously, so she is going to wait and see She has not received phone call to schedule her thyroid us.  She has no acute concerns today  [facility] 02/10/2018 12/30/2017  Decreased Interest 0 0  Down, Depressed, Hopeless 0 0  PHQ - 2 Score 0 0    No Known Allergies  Prior to Admission medications   Medication Sig Start Date End Date Taking? Authorizing Provider  ibuprofen (ADVIL,MOTRIN) 200 MG tablet Take 200 mg by mouth every 6 (six) hours as needed.    [provider]    History reviewed. No pertinent past medical history.  Past Surgical History:  Procedure Laterality Date  . CESAREAN SECTION      Social History   Tobacco Use  . Smoking status: Never Smoker  . Smokeless tobacco: Never Used  Substance Use Topics  . Alcohol use: No    Family History  Problem Relation Age of Onset  . Healthy Mother   . Healthy Father   . Healthy Sister   . Healthy Brother     ROS Per hpi  OBJECTIVE:  Blood pressure 122/70, pulse 74, temperature 97.6 F (36.4 C), resp. rate 16, height 5' (1.524 m), weight 134 lb 12.8 oz (61.1 kg), SpO2 100 %.  Physical Exam  Constitutional: She is oriented to person, place, and time.  HENT:  Head: Normocephalic and atraumatic.  Mouth/Throat: Mucous membranes are normal.  Eyes: Pupils are equal, round, and reactive to light. EOM are normal. No scleral icterus.  Neck: Neck supple.  Pulmonary/Chest: Effort normal.  Neurological: She is alert  and oriented to person, place, and time.  Skin: Skin is warm and dry.  Nursing note and vitals reviewed.   ASSESSMENT and PLAN  1. Goiter Normal TSH, pending us  2. Diarrhea, unspecified type Has seen GI, however it has spontaneously resolved. Will monitor clinically  Return in about 1 year (around 02/11/2019) for CPE.    Myles LippsIrma M Santiago, MD [facility] [address] [place], [place] [address] Ph.  [phone] Fax [phone]

## 2018-02-10 NOTE — Patient Instructions (Signed)
     IF you received an x-ray today, you will receive an invoice from [facility]. Please contact [facility] at [phone] with questions or concerns regarding your invoice.   IF you received labwork today, you will receive an invoice from [facility]. Please contact [facility] at [phone] with questions or concerns regarding your invoice.   Our billing staff will not be able to assist you with questions regarding bills from these companies.  You will be contacted with the lab results as soon as they are available. The fastest way to get your results is to activate your My Chart account. Instructions are located on the last page of this paperwork. If you have not heard from us regarding the results in 2 weeks, please contact this office.     

## 2020-03-08 ENCOUNTER — Ambulatory Visit: Payer: Self-pay | Attending: Internal Medicine

## 2020-03-08 DIAGNOSIS — Z23 Encounter for immunization: Secondary | ICD-10-CM

## 2020-03-08 NOTE — Progress Notes (Signed)
   Covid-19 Vaccination Clinic  Name:  Melinda Alvarez    MRN: [id number] DOB: [birth year]  03/08/2020  [name] was observed post Covid-19 immunization for 15 minutes without incident. She was provided with Vaccine Information Sheet and instruction to access the V-Safe system.   [name] was instructed to call 911 with any severe reactions post vaccine: Marland Kitchen Difficulty breathing  . Swelling of face and throat  . A fast heartbeat  . A bad rash all over body  . Dizziness and weakness   Immunizations Administered    Name Date Dose VIS Date Route   Pfizer COVID-19 Vaccine 03/08/2020 10:38 AM 0.3 mL 12/22/2018 Intramuscular   Manufacturer: [employer], [employer]   Lot: [id number]   NDC: [address]-1      Covid-19 Vaccination Clinic  Name:  Melinda Alvarez    MRN: [id number] DOB: 05/29/1981  03/08/2020  [name] was observed post Covid-19 immunization for 15 minutes without incident. She was provided with Vaccine Information Sheet and instruction to access the V-Safe system.   [name] was instructed to call 911 with any severe reactions post vaccine: Marland Kitchen Difficulty breathing  . Swelling of face and throat  . A fast heartbeat  . A bad rash all over body  . Dizziness and weakness   Immunizations Administered    Name Date Dose VIS Date Route   Pfizer COVID-19 Vaccine 03/08/2020 10:38 AM 0.3 mL 12/22/2018 Intramuscular   Manufacturer: [employer], [employer]   Lot: [id number]   NDC: [address]-2

## 2020-04-03 ENCOUNTER — Ambulatory Visit: Payer: Self-pay | Attending: Internal Medicine

## 2020-04-03 DIAGNOSIS — Z23 Encounter for immunization: Secondary | ICD-10-CM

## 2020-04-03 NOTE — Progress Notes (Signed)
   Covid-19 Vaccination Clinic  Name:  Amulya Quintin    MRN: [id number] DOB: 07-04-1981  04/03/2020  Ms. Arango was observed post Covid-19 immunization for 15 minutes without incident. She was provided with Vaccine Information Sheet and instruction to access the V-Safe system.   Ms. Hockenberry was instructed to call 911 with any severe reactions post vaccine: Marland Kitchen Difficulty breathing  . Swelling of face and throat  . A fast heartbeat  . A bad rash all over body  . Dizziness and weakness   Immunizations Administered    Name Date Dose VIS Date Route   Pfizer COVID-19 Vaccine 04/03/2020  8:08 AM 0.3 mL 12/22/2018 Intramuscular   Manufacturer: [employer], [employer]   Lot: [id number]   NDC: [address]-1

## 2021-01-01 ENCOUNTER — Ambulatory Visit: Payer: BLUE CROSS/BLUE SHIELD | Admitting: Family Medicine

## 2021-01-01 ENCOUNTER — Encounter: Payer: Self-pay | Admitting: Family Medicine

## 2021-01-01 ENCOUNTER — Other Ambulatory Visit: Payer: Self-pay

## 2021-01-01 VITALS — BP 117/82 | HR 88 | Temp 98.2°F | Ht 60.0 in | Wt 137.0 lb

## 2021-01-01 DIAGNOSIS — B029 Zoster without complications: Secondary | ICD-10-CM

## 2021-01-01 DIAGNOSIS — F439 Reaction to severe stress, unspecified: Secondary | ICD-10-CM

## 2021-01-01 MED ORDER — GABAPENTIN 300 MG PO CAPS: 300.0000 mg | ORAL_CAPSULE | Freq: Three times a day (TID) | ORAL | 1 refills | Status: AC

## 2021-01-01 MED ORDER — VALACYCLOVIR HCL 1 G PO TABS: 1000.0000 mg | ORAL_TABLET | Freq: Three times a day (TID) | ORAL | 0 refills | Status: AC

## 2021-01-01 MED ORDER — HYDROCODONE-ACETAMINOPHEN 5-325 MG PO TABS: 1.0000 | ORAL_TABLET | Freq: Four times a day (QID) | ORAL | 0 refills | Status: AC | PRN

## 2021-01-01 NOTE — Progress Notes (Signed)
Subjective:  Patient ID: Melinda Alvarez, female    DOB: 1981/10/01  Age: 40 y.o. MRN: [id number]  CC:  Chief Complaint  Patient presents with  . Rash    Pt reports on 12/28/2020 she felt tingle and then on the next day she noticed the rash showing up. Rash is located in the patches on the R side of the pt's upper body . PT reports the rash patches are painful and itchy.Pt states she noticed that after scratching and then touching another area of her body the rash would spread.PT has been using Cortizone-10 cream. This has help only slightly.     HPI Melinda Alvarez presents for   Rash R arm: Initial tingling on upper back 5 days ago. Redness,then blisters started about 3 days ago.  Painful, burning, itching. Nothing on left side.  No prior shingles.  Tx: cortisone cream- min relief.   No allergies. No pain meds.   Some increased stressors. Stress is getting better. Feels down at time - stressed but not depressed, no SI  Depression screen Melinda Alvarez 2/9 01/01/2021 02/10/2018 12/30/2017  Decreased Interest 0 0 0  Down, Depressed, Hopeless 0 0 0  PHQ - 2 Score 0 0 0    History Patient Active Problem List   Diagnosis Date Noted  . Generalized abdominal pain 12/30/2017   No past medical history on file. Past Surgical History:  Procedure Laterality Date  . CESAREAN SECTION     No Known Allergies Prior to Admission medications   Medication Sig Start Date End Date Taking? Authorizing Provider  Hydrocortisone (CORTIZONE-10 EX) Apply topically.   Yes [provider]  ibuprofen (ADVIL,MOTRIN) 200 MG tablet Take 200 mg by mouth every 6 (six) hours as needed. Patient not taking: Reported on 01/01/2021    [provider]   Social History   Socioeconomic History  . Marital status: Single    Spouse name: Not on file  . Number of children: Not on file  . Years of education: Not on file  . Highest education level: Not on file  Occupational History  . Not on file  Tobacco Use  .  Smoking status: Never Smoker  . Smokeless tobacco: Never Used  Substance and Sexual Activity  . Alcohol use: No  . Drug use: No  . Sexual activity: Never  Other Topics Concern  . Not on file  Social History Narrative  . Not on file   Social Determinants of Health   Financial Resource Strain: Not on file  Food Insecurity: Not on file  Transportation Needs: Not on file  Physical Activity: Not on file  Stress: Not on file  Social Connections: Not on file  Intimate Partner Violence: Not on file    Review of Systems Per hpi  Objective:   Vitals:   01/01/21 1202  BP: 117/82  Pulse: 88  Temp: 98.2 F (36.8 C)  TempSrc: Temporal  SpO2: 98%  Weight: 137 lb (62.1 kg)  Height: 5' (1.524 m)     Physical Exam Constitutional:      General: She is not in acute distress.    Appearance: She is well-developed and well-nourished.  HENT:     Head: Normocephalic and atraumatic.  Cardiovascular:     Rate and Rhythm: Normal rate.  Pulmonary:     Effort: Pulmonary effort is normal.  Skin:    General: Skin is warm.     Findings: Rash (see photo - R sided T1 distribution. ) present.  Neurological:  General: No focal deficit present.     Mental Status: She is alert and oriented to person, place, and time.  Psychiatric:        Mood and Affect: Mood and affect and mood normal.        Behavior: Behavior normal.        Thought Content: Thought content normal.           Assessment & Plan:  [name] is a 40 y.o. female . Herpes zoster without complication - Plan: gabapentin (NEURONTIN) 300 MG capsule, HYDROcodone-acetaminophen (NORCO/VICODIN) 5-325 MG tablet, valACYclovir (VALTREX) 1000 MG tablet  Situational stress  Shingles/zoster affecting T1 distribution likely on the right side.  Unfortunately symptoms started approximately 3 to 5 days ago but we will still try Valtrex, 1 g 3 times daily for 7 days.  Gabapentin 300 mg daily initially, slowly increase to twice daily  then 3 times daily if needed with potential side effects discussed.  Hydrocodone if needed for breakthrough pain with side effects and risk discussed.  Handout given with RTC precautions.  Also given handout on stress/stress management as may be a factor with recent symptoms.  Meds ordered this encounter  Medications  . gabapentin (NEURONTIN) 300 MG capsule    Sig: Take 1 capsule (300 mg total) by mouth 3 (three) times daily. Start with QD dosing, then slowly increase to tid if needed.    Dispense:  30 capsule    Refill:  1  . HYDROcodone-acetaminophen (NORCO/VICODIN) 5-325 MG tablet    Sig: Take 1 tablet by mouth every 6 (six) hours as needed for moderate pain.    Dispense:  20 tablet    Refill:  0  . valACYclovir (VALTREX) 1000 MG tablet    Sig: Take 1 tablet (1,000 mg total) by mouth 3 (three) times daily.    Dispense:  21 tablet    Refill:  0   Patient Instructions   Start valtrex today to see if that still may be helpful.  Try nerve pain med first - $Remov'300mg'dOJjOx$  pill - start once per day, then increase to twice per day if tolerated. Can be taken up to 3 times per day. Hydrocodone if needed for pain as well - be careful with sedation and side effects combining this medicine.   Shingles  Shingles is an infection. It gives you a painful skin rash and blisters that have fluid in them. Shingles is caused by the same germ (virus) that causes chickenpox. Shingles only happens in people who:  Have had chickenpox.  Have been given a shot of medicine (vaccine) to protect against chickenpox. Shingles is rare in this group. The first symptoms of shingles may be itching, tingling, or pain in an area on your skin. A rash will show on your skin a few days or weeks later. The rash is likely to be on one side of your body. The rash usually has a shape like a belt or a band. Over time, the rash turns into fluid-filled blisters. The blisters will break open, change into scabs, and dry up. Medicines  may:  Help with pain and itching.  Help you get better sooner.  Help to prevent long-term problems. Follow these instructions at home: Medicines  Take over-the-counter and prescription medicines only as told by your doctor.  Put on an anti-itch cream or numbing cream where you have a rash, blisters, or scabs. Do this as told by your doctor. Helping with itching and discomfort  Put cold, wet cloths (  cold compresses) on the area of the rash or blisters as told by your doctor.  Cool baths can help you feel better. Try adding baking soda or dry oatmeal to the water to lessen itching. Do not bathe in hot water.   Blister and rash care  Keep your rash covered with a loose bandage (dressing).  Wear loose clothing that does not rub on your rash.  Keep your rash and blisters clean. To do this, wash the area with mild soap and cool water as told by your doctor.  Check your rash every day for signs of infection. Check for: ? More redness, swelling, or pain. ? Fluid or blood. ? Warmth. ? Pus or a bad smell.  Do not scratch your rash. Do not pick at your blisters. To help you to not scratch: ? Keep your fingernails clean and cut short. ? Wear gloves or mittens when you sleep, if scratching is a problem. General instructions  Rest as told by your doctor.  Keep all follow-up visits as told by your doctor. This is important.  Wash your hands often with soap and water. If soap and water are not available, use hand sanitizer. Doing this lowers your chance of getting a skin infection caused by germs (bacteria).  Your infection can cause chickenpox in people who have never had chickenpox or never got a shot of chickenpox vaccine. If you have blisters that did not change into scabs yet, try not to touch other people or be around other people, especially: ? Babies. ? Pregnant women. ? Children who have areas of red, itchy, or rough skin (eczema). ? Very old people who have  transplants. ? People who have a long-term (chronic) sickness, like cancer or AIDS. Contact a doctor if:  Your pain does not get better with medicine.  Your pain does not get better after the rash heals.  You have any signs of infection in the rash area. These signs include: ? More redness, swelling, or pain around the rash. ? Fluid or blood coming from the rash. ? The rash area feeling warm to the touch. ? Pus or a bad smell coming from the rash. Get help right away if:  The rash is on your face or nose.  You have pain in your face or pain by your eye.  You lose feeling on one side of your face.  You have trouble seeing.  You have ear pain, or you have ringing in your ear.  You have a loss of taste.  Your condition gets worse. Summary  Shingles gives you a painful skin rash and blisters that have fluid in them.  Shingles is an infection. It is caused by the same germ (virus) that causes chickenpox.  Keep your rash covered with a loose bandage (dressing). Wear loose clothing that does not rub on your rash.  If you have blisters that did not change into scabs yet, try not to touch other people or be around people. This information is not intended to replace advice given to you by your health care provider. Make sure you discuss any questions you have with your health care provider. Document Revised: 02/05/2019 Document Reviewed: 06/18/2017 Elsevier Patient Education  2021 [place], Adult Feeling a certain amount of stress is normal. Stress helps our body and mind get ready to deal with the demands of life. Stress hormones can motivate you to do well at work and meet your responsibilities. However severe or long-lasting (chronic)  stress can affect your mental and physical health. Chronic stress puts you at higher risk for anxiety, depression, and other health problems like digestive problems, muscle aches, heart disease, high blood pressure, and  stroke. What are the causes? Common causes of stress include:  Demands from work, such as deadlines, feeling overworked, or having long hours.  Pressures at home, such as money issues, disagreements with a spouse, or parenting issues.  Pressures from major life changes, such as divorce, moving, loss of a loved one, or chronic illness. You may be at higher risk for stress-related problems if you do not get enough sleep, are in poor health, do not have emotional support, or have a mental health disorder like anxiety or depression. How to recognize stress Stress can make you:  Have trouble sleeping.  Feel sad, anxious, irritable, or overwhelmed.  Lose your appetite.  Overeat or want to eat unhealthy foods.  Want to use drugs or alcohol. Stress can also cause physical symptoms, such as:  Sore, tense muscles, especially in the shoulders and neck.  Headaches.  Trouble breathing.  A faster heart rate.  Stomach pain, nausea, or vomiting.  Diarrhea or constipation.  Trouble concentrating. Follow these instructions at home: Lifestyle  Identify the source of your stress and your reaction to it. See a therapist who can help you change your reactions.  When there are stressful events: ? Talk about it with family, friends, or co-workers. ? Try to think realistically about stressful events and not ignore them or overreact. ? Try to find the positives in a stressful situation and not focus on the negatives. ? Cut back on responsibilities at work and home, if possible. Ask for help from friends or family members if you need it.  Find ways to cope with stress, such as: ? Meditation. ? Deep breathing. ? Yoga or tai chi. ? Progressive muscle relaxation. ? Doing art, playing music, or reading. ? Making time for fun activities. ? Spending time with family and friends.  Get support from family, friends, or spiritual resources. Eating and drinking  Eat a healthy diet. This  includes: ? Eating foods that are high in fiber, such as beans, whole grains, and fresh fruits and vegetables. ? Limiting foods that are high in fat and processed sugars, such as fried and sweet foods.  Do not skip meals or overeat.  Drink enough fluid to keep your urine pale yellow. Alcohol use  Do not drink alcohol if: ? Your health care provider tells you not to drink. ? You are pregnant, may be pregnant, or are planning to become pregnant.  Drinking alcohol is a way some people try to ease their stress. This can be dangerous, so if you drink alcohol: ? Limit how much you use to:  0-1 drink a day for women.  0-2 drinks a day for men. ? Be aware of how much alcohol is in your drink. In the [place] one drink equals one 12 oz bottle of beer (355 mL), one 5 oz glass of wine (148 mL), or one 1 oz glass of hard liquor (44 mL). Activity  Include 30 minutes of exercise in your daily schedule. Exercise is a good stress reducer.  Include time in your day for an activity that you find relaxing. Try taking a walk, going on a bike ride, reading a book, or listening to music.  Schedule your time in a way that lowers stress, and keep a consistent schedule. Prioritize what is most important to get  done.   General instructions  Get enough sleep. Try to go to sleep and get up at about the same time every day.  Take over-the-counter and prescription medicines only as told by your health care provider.  Do not use any products that contain nicotine or tobacco, such as cigarettes, e-cigarettes, and chewing tobacco. If you need help quitting, ask your health care provider.  Do not use drugs or smoke to cope with stress.  Keep all follow-up visits as told by your health care provider. This is important. Where to find support  Talk with your health care provider about stress management or finding a support group.  Find a therapist to work with you on your stress management techniques. Contact a  health care provider if:  Your stress symptoms get worse.  You are unable to manage your stress at home.  You are struggling to stop using drugs or alcohol. Get help right away if:  You may be a danger to yourself or others.  You have any thoughts of death or suicide. If you ever feel like you may hurt yourself or others, or have thoughts about taking your own life, get help right away. You can go to your nearest emergency department or call:  Your local emergency services (911 in the [place]  A suicide crisis helpline, such as the [facility] at (443)020-9888. This is open 24 hours a day. Summary  Feeling a certain amount of stress is normal, but severe or long-lasting (chronic) stress can affect your mental and physical health.  Chronic stress can put you at higher risk for anxiety, depression, and other health problems like digestive problems, muscle aches, heart disease, high blood pressure, and stroke.  You may be at higher risk for stress-related problems if you do not get enough sleep, are in poor health, lack emotional support, or have a mental health disorder like anxiety or depression.  Identify the source of your stress and your reaction to it. Try talking about stressful events with family, friends, or co-workers, finding a coping method, or getting support from spiritual resources.  If you need more help, talk with your health care provider about finding a support group or a mental health therapist. This information is not intended to replace advice given to you by your health care provider. Make sure you discuss any questions you have with your health care provider. Document Revised: 05/12/2019 Document Reviewed: 05/12/2019 Elsevier Patient Education  [place].   If you have lab work done today you will be contacted with your lab results within the next 2 weeks.  If you have not heard from [place] then please contact us. The fastest way to  get your results is to register for My Chart.   IF you received an x-ray today, you will receive an invoice from [facility]. Please contact [facility] at [phone] with questions or concerns regarding your invoice.   IF you received labwork today, you will receive an invoice from [facility]. Please contact [facility] at [phone] with questions or concerns regarding your invoice.   Our billing staff will not be able to assist you with questions regarding bills from these companies.  You will be contacted with the lab results as soon as they are available. The fastest way to get your results is to activate your My Chart account. Instructions are located on the last page of this paperwork. If you have not heard from [place] regarding the results in 2 weeks, please  contact this office.         Signed, Merri Ray, MD [facility]

## 2021-01-01 NOTE — Patient Instructions (Addendum)
Start valtrex today to see if that still may be helpful.  Try nerve pain med first - 359m pill - start once per day, then increase to twice per day if tolerated. Can be taken up to 3 times per day. Hydrocodone if needed for pain as well - be careful with sedation and side effects combining this medicine.   Shingles  Shingles is an infection. It gives you a painful skin rash and blisters that have fluid in them. Shingles is caused by the same germ (virus) that causes chickenpox. Shingles only happens in people who:  Have had chickenpox.  Have been given a shot of medicine (vaccine) to protect against chickenpox. Shingles is rare in this group. The first symptoms of shingles may be itching, tingling, or pain in an area on your skin. A rash will show on your skin a few days or weeks later. The rash is likely to be on one side of your body. The rash usually has a shape like a belt or a band. Over time, the rash turns into fluid-filled blisters. The blisters will break open, change into scabs, and dry up. Medicines may:  Help with pain and itching.  Help you get better sooner.  Help to prevent long-term problems. Follow these instructions at home: Medicines  Take over-the-counter and prescription medicines only as told by your doctor.  Put on an anti-itch cream or numbing cream where you have a rash, blisters, or scabs. Do this as told by your doctor. Helping with itching and discomfort  Put cold, wet cloths (cold compresses) on the area of the rash or blisters as told by your doctor.  Cool baths can help you feel better. Try adding baking soda or dry oatmeal to the water to lessen itching. Do not bathe in hot water.   Blister and rash care  Keep your rash covered with a loose bandage (dressing).  Wear loose clothing that does not rub on your rash.  Keep your rash and blisters clean. To do this, wash the area with mild soap and cool water as told by your doctor.  Check your rash  every day for signs of infection. Check for: ? More redness, swelling, or pain. ? Fluid or blood. ? Warmth. ? Pus or a bad smell.  Do not scratch your rash. Do not pick at your blisters. To help you to not scratch: ? Keep your fingernails clean and cut short. ? Wear gloves or mittens when you sleep, if scratching is a problem. General instructions  Rest as told by your doctor.  Keep all follow-up visits as told by your doctor. This is important.  Wash your hands often with soap and water. If soap and water are not available, use hand sanitizer. Doing this lowers your chance of getting a skin infection caused by germs (bacteria).  Your infection can cause chickenpox in people who have never had chickenpox or never got a shot of chickenpox vaccine. If you have blisters that did not change into scabs yet, try not to touch other people or be around other people, especially: ? Babies. ? Pregnant women. ? Children who have areas of red, itchy, or rough skin (eczema). ? Very old people who have transplants. ? People who have a long-term (chronic) sickness, like cancer or AIDS. Contact a doctor if:  Your pain does not get better with medicine.  Your pain does not get better after the rash heals.  You have any signs of infection in the rash area.  These signs include: ? More redness, swelling, or pain around the rash. ? Fluid or blood coming from the rash. ? The rash area feeling warm to the touch. ? Pus or a bad smell coming from the rash. Get help right away if:  The rash is on your face or nose.  You have pain in your face or pain by your eye.  You lose feeling on one side of your face.  You have trouble seeing.  You have ear pain, or you have ringing in your ear.  You have a loss of taste.  Your condition gets worse. Summary  Shingles gives you a painful skin rash and blisters that have fluid in them.  Shingles is an infection. It is caused by the same germ (virus) that  causes chickenpox.  Keep your rash covered with a loose bandage (dressing). Wear loose clothing that does not rub on your rash.  If you have blisters that did not change into scabs yet, try not to touch other people or be around people. This information is not intended to replace advice given to you by your health care provider. Make sure you discuss any questions you have with your health care provider. Document Revised: 02/05/2019 Document Reviewed: 06/18/2017 Elsevier Patient Education  2021 [place], Adult Feeling a certain amount of stress is normal. Stress helps our body and mind get ready to deal with the demands of life. Stress hormones can motivate you to do well at work and meet your responsibilities. However severe or long-lasting (chronic) stress can affect your mental and physical health. Chronic stress puts you at higher risk for anxiety, depression, and other health problems like digestive problems, muscle aches, heart disease, high blood pressure, and stroke. What are the causes? Common causes of stress include:  Demands from work, such as deadlines, feeling overworked, or having long hours.  Pressures at home, such as money issues, disagreements with a spouse, or parenting issues.  Pressures from major life changes, such as divorce, moving, loss of a loved one, or chronic illness. You may be at higher risk for stress-related problems if you do not get enough sleep, are in poor health, do not have emotional support, or have a mental health disorder like anxiety or depression. How to recognize stress Stress can make you:  Have trouble sleeping.  Feel sad, anxious, irritable, or overwhelmed.  Lose your appetite.  Overeat or want to eat unhealthy foods.  Want to use drugs or alcohol. Stress can also cause physical symptoms, such as:  Sore, tense muscles, especially in the shoulders and neck.  Headaches.  Trouble breathing.  A faster heart  rate.  Stomach pain, nausea, or vomiting.  Diarrhea or constipation.  Trouble concentrating. Follow these instructions at home: Lifestyle  Identify the source of your stress and your reaction to it. See a therapist who can help you change your reactions.  When there are stressful events: ? Talk about it with family, friends, or co-workers. ? Try to think realistically about stressful events and not ignore them or overreact. ? Try to find the positives in a stressful situation and not focus on the negatives. ? Cut back on responsibilities at work and home, if possible. Ask for help from friends or family members if you need it.  Find ways to cope with stress, such as: ? Meditation. ? Deep breathing. ? Yoga or tai chi. ? Progressive muscle relaxation. ? Doing art, playing music, or reading. ? Making  time for fun activities. ? Spending time with family and friends.  Get support from family, friends, or spiritual resources. Eating and drinking  Eat a healthy diet. This includes: ? Eating foods that are high in fiber, such as beans, whole grains, and fresh fruits and vegetables. ? Limiting foods that are high in fat and processed sugars, such as fried and sweet foods.  Do not skip meals or overeat.  Drink enough fluid to keep your urine pale yellow. Alcohol use  Do not drink alcohol if: ? Your health care provider tells you not to drink. ? You are pregnant, may be pregnant, or are planning to become pregnant.  Drinking alcohol is a way some people try to ease their stress. This can be dangerous, so if you drink alcohol: ? Limit how much you use to:  0-1 drink a day for women.  0-2 drinks a day for men. ? Be aware of how much alcohol is in your drink. In the [place] one drink equals one 12 oz bottle of beer (355 mL), one 5 oz glass of wine (148 mL), or one 1 oz glass of hard liquor (44 mL). Activity  Include 30 minutes of exercise in your daily schedule. Exercise is a  good stress reducer.  Include time in your day for an activity that you find relaxing. Try taking a walk, going on a bike ride, reading a book, or listening to music.  Schedule your time in a way that lowers stress, and keep a consistent schedule. Prioritize what is most important to get done.   General instructions  Get enough sleep. Try to go to sleep and get up at about the same time every day.  Take over-the-counter and prescription medicines only as told by your health care provider.  Do not use any products that contain nicotine or tobacco, such as cigarettes, e-cigarettes, and chewing tobacco. If you need help quitting, ask your health care provider.  Do not use drugs or smoke to cope with stress.  Keep all follow-up visits as told by your health care provider. This is important. Where to find support  Talk with your health care provider about stress management or finding a support group.  Find a therapist to work with you on your stress management techniques. Contact a health care provider if:  Your stress symptoms get worse.  You are unable to manage your stress at home.  You are struggling to stop using drugs or alcohol. Get help right away if:  You may be a danger to yourself or others.  You have any thoughts of death or suicide. If you ever feel like you may hurt yourself or others, or have thoughts about taking your own life, get help right away. You can go to your nearest emergency department or call:  Your local emergency services (911 in the [place]  A suicide crisis helpline, such as the [facility] at 669-072-5747. This is open 24 hours a day. Summary  Feeling a certain amount of stress is normal, but severe or long-lasting (chronic) stress can affect your mental and physical health.  Chronic stress can put you at higher risk for anxiety, depression, and other health problems like digestive problems, muscle aches, heart disease,  high blood pressure, and stroke.  You may be at higher risk for stress-related problems if you do not get enough sleep, are in poor health, lack emotional support, or have a mental health disorder like anxiety or depression.  Identify the  source of your stress and your reaction to it. Try talking about stressful events with family, friends, or co-workers, finding a coping method, or getting support from spiritual resources.  If you need more help, talk with your health care provider about finding a support group or a mental health therapist. This information is not intended to replace advice given to you by your health care provider. Make sure you discuss any questions you have with your health care provider. Document Revised: 05/12/2019 Document Reviewed: 05/12/2019 Elsevier Patient Education  [place].   If you have lab work done today you will be contacted with your lab results within the next 2 weeks.  If you have not heard from [place] then please contact us. The fastest way to get your results is to register for My Chart.   IF you received an x-ray today, you will receive an invoice from [facility]. Please contact [facility] at [phone] with questions or concerns regarding your invoice.   IF you received labwork today, you will receive an invoice from [facility]. Please contact [facility] at [phone] with questions or concerns regarding your invoice.   Our billing staff will not be able to assist you with questions regarding bills from these companies.  You will be contacted with the lab results as soon as they are available. The fastest way to get your results is to activate your My Chart account. Instructions are located on the last page of this paperwork. If you have not heard from [place] regarding the results in 2 weeks, please contact this office.

## 2021-01-24 ENCOUNTER — Other Ambulatory Visit: Payer: Self-pay

## 2021-01-24 ENCOUNTER — Ambulatory Visit (HOSPITAL_COMMUNITY)
Admission: EM | Admit: 2021-01-24 | Discharge: 2021-01-24 | Disposition: A | Discharge status: Home / Self Care | Payer: BLUE CROSS/BLUE SHIELD | Attending: Family Medicine | Admitting: Family Medicine

## 2021-01-24 ENCOUNTER — Encounter (HOSPITAL_COMMUNITY): Payer: Self-pay | Admitting: Emergency Medicine

## 2021-01-24 DIAGNOSIS — K130 Diseases of lips: Secondary | ICD-10-CM

## 2021-01-24 HISTORY — DX: Zoster without complications: B02.9

## 2021-01-24 MED ORDER — MUPIROCIN 2 % EX OINT: 1.0000 "application " | TOPICAL_OINTMENT | Freq: Two times a day (BID) | CUTANEOUS | 0 refills | Status: AC

## 2021-01-24 NOTE — ED Triage Notes (Signed)
Pt presents today with c/o of soreness to corners of mouth described as "cold sores" x 3 months with no relief with OTC meds.

## 2021-01-24 NOTE — Discharge Instructions (Signed)
Apply Aquaphor often throughout the day to keep the areas at the corners of your mouth protected and moisturized.  Avoid scented facial products or Chapstick's as this may further irritate the areas.  Apply the mupirocin ointment at least twice a day to protect from bacterial infection

## 2021-01-24 NOTE — ED Provider Notes (Signed)
[facility]    CSN: [id number] Arrival date & time: 01/24/21  0911      History   Chief Complaint Chief Complaint  Patient presents with  . Mouth Lesions    HPI Melinda Alvarez is a 40 y.o. female.   Patient presenting today with about 74-month history of cracking, swelling, redness, irritation at corners of mouth bilaterally.  She states she has been trying Vaseline and Abreva without much relief.  Notes that it gets worse if she eats spicy foods.  Denies past history of this happening, new products used, sores within her mouth, drainage from the area.     Past Medical History:  Diagnosis Date  . Shingles     Patient Active Problem List   Diagnosis Date Noted  . Generalized abdominal pain 12/30/2017    Past Surgical History:  Procedure Laterality Date  . CESAREAN SECTION      OB History   No obstetric history on file.      Home Medications    Prior to Admission medications   Medication Sig Start Date End Date Taking? Authorizing Provider  gabapentin (NEURONTIN) 300 MG capsule Take 1 capsule (300 mg total) by mouth 3 (three) times daily. Start with QD dosing, then slowly increase to tid if needed. 01/01/21  Yes Shade Flood, MD  HYDROcodone-acetaminophen (NORCO/VICODIN) 5-325 MG tablet Take 1 tablet by mouth every 6 (six) hours as needed for moderate pain. 01/01/21  Yes Shade Flood, MD  Hydrocortisone (CORTIZONE-10 EX) Apply topically.   Yes [provider]  ibuprofen (ADVIL,MOTRIN) 200 MG tablet Take 200 mg by mouth every 6 (six) hours as needed.   Yes [provider]  mupirocin ointment (BACTROBAN) 2 % Apply 1 application topically 2 (two) times daily. 01/24/21  Yes Particia Nearing, PA-C  valACYclovir (VALTREX) 1000 MG tablet Take 1 tablet (1,000 mg total) by mouth 3 (three) times daily. 01/01/21  Yes Shade Flood, MD    Family History Family History  Problem Relation Age of Onset  . Healthy Mother   . Healthy Father    . Healthy Sister   . Healthy Brother     Social History Social History   Tobacco Use  . Smoking status: Never Smoker  . Smokeless tobacco: Never Used  Substance Use Topics  . Alcohol use: No  . Drug use: No     Allergies   Patient has no known allergies.   Review of Systems Review of Systems Per HPI  Physical Exam Triage Vital Signs ED Triage Vitals  Enc Vitals Group     BP 01/24/21 0937 117/80     Pulse Rate 01/24/21 0937 70     Resp 01/24/21 0937 16     Temp 01/24/21 0937 98.4 F (36.9 C)     Temp Source 01/24/21 0937 Oral     SpO2 01/24/21 0937 99 %     Weight --      Height --      Head Circumference --      Peak Flow --      Pain Score 01/24/21 0933 4     Pain Loc --      Pain Edu? --      Excl. in GC? --    No data found.  Updated Vital Signs BP 117/80 (BP Location: Right Arm)   Pulse 70   Temp 98.4 F (36.9 C) (Oral)   Resp 16   LMP 01/14/2021 (Approximate)   SpO2 99%  Visual Acuity Right Eye Distance:   Left Eye Distance:   Bilateral Distance:    Right Eye Near:   Left Eye Near:    Bilateral Near:     Physical Exam Vitals and nursing note reviewed.  Constitutional:      Appearance: Normal appearance. She is not ill-appearing.  HENT:     Head: Atraumatic.     Nose: Nose normal.     Mouth/Throat:     Mouth: Mucous membranes are moist.     Pharynx: Oropharynx is clear. No oropharyngeal exudate or posterior oropharyngeal erythema.  Eyes:     Extraocular Movements: Extraocular movements intact.     Conjunctiva/sclera: Conjunctivae normal.  Cardiovascular:     Rate and Rhythm: Normal rate and regular rhythm.     Heart sounds: Normal heart sounds.  Pulmonary:     Effort: Pulmonary effort is normal.     Breath sounds: Normal breath sounds.  Musculoskeletal:        General: Normal range of motion.     Cervical back: Normal range of motion and neck supple.  Skin:    General: Skin is warm.     Comments: Bilateral corners of  mouth erythematous, edematous, cracking.  No ulcerations, drainage present.  Neurological:     Mental Status: She is alert and oriented to person, place, and time.  Psychiatric:        Mood and Affect: Mood normal.        Thought Content: Thought content normal.        Judgment: Judgment normal.      UC Treatments / Results  Labs (all labs ordered are listed, but only abnormal results are displayed) Labs Reviewed - No data to display  EKG   Radiology No results found.  Procedures Procedures (including critical care time)  Medications Ordered in UC Medications - No data to display  Initial Impression / Assessment and Plan / UC Course  I have reviewed the triage vital signs and the nursing notes.  Pertinent labs & imaging results that were available during my care of the patient were reviewed by me and considered in my medical decision making (see chart for details).     Chronic irritation/cheilitis.  Will send mupirocin ointment for twice daily use to protect from bacterial infection while open sores, discussed Aquaphor constantly throughout the day to keep areas moisturized and protected.  No scented facial products or Chapstick's, avoid spicy or very acidic foods at this time.  Follow-up with primary care for recheck.  Final Clinical Impressions(s) / UC Diagnoses   Final diagnoses:  Cheilitis     Discharge Instructions     Apply Aquaphor often throughout the day to keep the areas at the corners of your mouth protected and moisturized.  Avoid scented facial products or Chapstick's as this may further irritate the areas.  Apply the mupirocin ointment at least twice a day to protect from bacterial infection    ED Prescriptions    Medication Sig Dispense Auth. Provider   mupirocin ointment (BACTROBAN) 2 % Apply 1 application topically 2 (two) times daily. 22 g Particia Nearing, [place]     PDMP not reviewed this encounter.   Particia Nearing,  [place] 01/24/21 1053

## 2023-01-02 ENCOUNTER — Telehealth: Payer: Self-pay

## 2023-01-02 NOTE — Telephone Encounter (Signed)
Telephoned patient at mobile number. Left a voice message with [facility] (scholarship) contact information.

## 2024-07-21 ENCOUNTER — Encounter (HOSPITAL_COMMUNITY): Payer: Self-pay

## 2024-07-21 ENCOUNTER — Ambulatory Visit (HOSPITAL_COMMUNITY)
Admission: EM | Admit: 2024-07-21 | Discharge: 2024-07-21 | Disposition: A | Discharge status: Home / Self Care | Attending: Physician Assistant | Admitting: Physician Assistant

## 2024-07-21 DIAGNOSIS — R519 Headache, unspecified: Secondary | ICD-10-CM | POA: Diagnosis not present

## 2024-07-21 MED ORDER — RIZATRIPTAN BENZOATE 5 MG PO TABS: 5.0000 mg | ORAL_TABLET | ORAL | 0 refills | Status: AC | PRN

## 2024-07-21 MED ORDER — KETOROLAC TROMETHAMINE 30 MG/ML IJ SOLN
30.0000 mg | Freq: Once | INTRAMUSCULAR | Status: AC
  Administered 2024-07-21: 30 mg via INTRAMUSCULAR

## 2024-07-21 MED ORDER — ONDANSETRON 4 MG PO TBDP
ORAL_TABLET | ORAL | Status: AC
  Filled 2024-07-21: qty 1

## 2024-07-21 MED ORDER — KETOROLAC TROMETHAMINE 30 MG/ML IJ SOLN
INTRAMUSCULAR | Status: AC
  Filled 2024-07-21: qty 1

## 2024-07-21 MED ORDER — ONDANSETRON 4 MG PO TBDP
4.0000 mg | ORAL_TABLET | Freq: Once | ORAL | Status: AC
  Administered 2024-07-21: 4 mg via ORAL

## 2024-07-21 NOTE — Discharge Instructions (Signed)
 Recommend rest, drink plenty of fluids Can continue with Tylenol  as needed for headache.   I have sent in a medication called Maxalt  you can take

## 2024-07-21 NOTE — ED Triage Notes (Signed)
 Pt states headache for the past 4 days.  States she is sensitive to light and sound.  States she has been taking advil at home with relief but after 4 hours the pain comes back.

## 2024-07-21 NOTE — ED Provider Notes (Signed)
 [facility]    CSN: [id number] Arrival date & time: 07/21/24  1634      History   Chief Complaint Chief Complaint  Patient presents with   Headache    HPI Melinda Alvarez is a 43 y.o. female.   Patient presents with a headache that started about 4 days ago.  She reports history of migraines.  She states sensitivity to light and sound.  She has been taking ibuprofen at home with temporary relief.  She reports intermittent nausea, But denies vomiting.    Past Medical History:  Diagnosis Date   Shingles     Patient Active Problem List   Diagnosis Date Noted   Generalized abdominal pain 12/30/2017    Past Surgical History:  Procedure Laterality Date   CESAREAN SECTION      OB History   No obstetric history on file.      Home Medications    Prior to Admission medications   Medication Sig Start Date End Date Taking? Authorizing Provider  rizatriptan  (MAXALT ) 5 MG tablet Take 1 tablet (5 mg total) by mouth as needed for migraine. May repeat in 2 hours if needed 07/21/24  Yes Ward, Harlene PEDLAR, PA-C  gabapentin  (NEURONTIN ) 300 MG capsule Take 1 capsule (300 mg total) by mouth 3 (three) times daily. Start with QD dosing, then slowly increase to tid if needed. 01/01/21   Levora Reyes SAUNDERS, MD  HYDROcodone -acetaminophen  (NORCO/VICODIN) 5-325 MG tablet Take 1 tablet by mouth every 6 (six) hours as needed for moderate pain. 01/01/21   Levora Reyes SAUNDERS, MD  Hydrocortisone (CORTIZONE-10 EX) Apply topically.    [provider]  ibuprofen (ADVIL,MOTRIN) 200 MG tablet Take 200 mg by mouth every 6 (six) hours as needed.    [provider]  mupirocin  ointment (BACTROBAN ) 2 % Apply 1 application topically 2 (two) times daily. 01/24/21   Stuart Vernell Norris, PA-C  valACYclovir  (VALTREX ) 1000 MG tablet Take 1 tablet (1,000 mg total) by mouth 3 (three) times daily. 01/01/21   Levora Reyes SAUNDERS, MD    Family History Family History  Problem Relation Age of Onset    Healthy Mother    Healthy Father    Healthy Sister    Healthy Brother     Social History Social History   Tobacco Use   Smoking status: Never   Smokeless tobacco: Never  Substance Use Topics   Alcohol use: No   Drug use: No     Allergies   Patient has no known allergies.   Review of Systems Review of Systems  Constitutional:  Negative for chills and fever.  HENT:  Negative for ear pain and sore throat.   Eyes:  Negative for pain and visual disturbance.  Respiratory:  Negative for cough and shortness of breath.   Cardiovascular:  Negative for chest pain and palpitations.  Gastrointestinal:  Negative for abdominal pain and vomiting.  Genitourinary:  Negative for dysuria and hematuria.  Musculoskeletal:  Negative for arthralgias and back pain.  Skin:  Negative for color change and rash.  Neurological:  Positive for headaches. Negative for seizures and syncope.  All other systems reviewed and are negative.    Physical Exam Triage Vital Signs ED Triage Vitals  Encounter Vitals Group     BP 07/21/24 1726 123/86     Girls Systolic BP Percentile --      Girls Diastolic BP Percentile --      Boys Systolic BP Percentile --      Boys  Diastolic BP Percentile --      Pulse Rate 07/21/24 1726 68     Resp 07/21/24 1726 16     Temp 07/21/24 1726 98 F (36.7 C)     Temp Source 07/21/24 1726 Oral     SpO2 07/21/24 1726 98 %     Weight --      Height --      Head Circumference --      Peak Flow --      Pain Score 07/21/24 1724 7     Pain Loc --      Pain Education --      Exclude from Growth Chart --    No data found.  Updated Vital Signs BP 123/86 (BP Location: Left Arm)   Pulse 68   Temp 98 F (36.7 C) (Oral)   Resp 16   LMP 07/20/2024 (Exact Date)   SpO2 98%   Visual Acuity Right Eye Distance:   Left Eye Distance:   Bilateral Distance:    Right Eye Near:   Left Eye Near:    Bilateral Near:     Physical Exam Vitals and nursing note reviewed.   Constitutional:      General: She is not in acute distress.    Appearance: She is well-developed.  HENT:     Head: Normocephalic and atraumatic.  Eyes:     Conjunctiva/sclera: Conjunctivae normal.  Cardiovascular:     Rate and Rhythm: Normal rate and regular rhythm.     Heart sounds: No murmur heard. Pulmonary:     Effort: Pulmonary effort is normal. No respiratory distress.     Breath sounds: Normal breath sounds.  Abdominal:     Palpations: Abdomen is soft.     Tenderness: There is no abdominal tenderness.  Musculoskeletal:        General: No swelling.     Cervical back: Neck supple.  Skin:    General: Skin is warm and dry.     Capillary Refill: Capillary refill takes less than 2 seconds.  Neurological:     Mental Status: She is alert.  Psychiatric:        Mood and Affect: Mood normal.      UC Treatments / Results  Labs (all labs ordered are listed, but only abnormal results are displayed) Labs Reviewed - No data to display  EKG   Radiology No results found.  Procedures Procedures (including critical care time)  Medications Ordered in UC Medications  ketorolac  (TORADOL ) 30 MG/ML injection 30 mg (30 mg Intramuscular Given 07/21/24 1750)  ondansetron  (ZOFRAN -ODT) disintegrating tablet 4 mg (4 mg Oral Given 07/21/24 1750)    Initial Impression / Assessment and Plan / UC Course  I have reviewed the triage vital signs and the nursing notes.  Pertinent labs & imaging results that were available during my care of the patient were reviewed by me and considered in my medical decision making (see chart for details).     Toradol  and Zofran  given in clinic today with reduction of symptoms.  Will send in a prescription for Maxalt .  Advise follow-up with primary care physician if symptoms become more recurrent. Final Clinical Impressions(s) / UC Diagnoses   Final diagnoses:  Acute nonintractable headache, unspecified headache type     Discharge Instructions       Recommend rest, drink plenty of fluids Can continue with Tylenol  as needed for headache.   I have sent in a medication called Maxalt  you can take  ED Prescriptions     Medication Sig Dispense Auth. Provider   rizatriptan  (MAXALT ) 5 MG tablet Take 1 tablet (5 mg total) by mouth as needed for migraine. May repeat in 2 hours if needed 10 tablet Ward, Carinne Brandenburger Z, PA-C      PDMP not reviewed this encounter.   Ward, Harlene PEDLAR, PA-C 07/21/24 1935
